# Patient Record
Sex: Male | Born: 1995 | Race: White | Hispanic: No | Marital: Single | Smoking: Current every day smoker
Health system: Southern US, Community
[De-identification: ages and names within clinical notes are randomized; demographics above are authoritative.]

---
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
## 2014-12-10 ENCOUNTER — Ambulatory Visit: Payer: Self-pay | Admitting: Family Medicine

## 2017-08-07 ENCOUNTER — Ambulatory Visit: Payer: Self-pay | Admitting: Family Medicine

## 2019-07-03 ENCOUNTER — Other Ambulatory Visit: Payer: Self-pay

## 2019-07-03 ENCOUNTER — Encounter: Payer: Self-pay | Admitting: Adult Health

## 2019-07-03 ENCOUNTER — Ambulatory Visit (INDEPENDENT_AMBULATORY_CARE_PROVIDER_SITE_OTHER): Payer: Managed Care, Other (non HMO) | Admitting: Adult Health

## 2019-07-03 VITALS — BP 138/86 | HR 92 | Temp 97.3°F | Resp 16 | Ht 67.0 in | Wt 128.4 lb

## 2019-07-03 DIAGNOSIS — Z0000 Encounter for general adult medical examination without abnormal findings: Secondary | ICD-10-CM

## 2019-07-03 DIAGNOSIS — F909 Attention-deficit hyperactivity disorder, unspecified type: Secondary | ICD-10-CM

## 2019-07-03 DIAGNOSIS — Z1389 Encounter for screening for other disorder: Secondary | ICD-10-CM

## 2019-07-03 DIAGNOSIS — R5383 Other fatigue: Secondary | ICD-10-CM

## 2019-07-03 DIAGNOSIS — E559 Vitamin D deficiency, unspecified: Secondary | ICD-10-CM | POA: Insufficient documentation

## 2019-07-03 DIAGNOSIS — F17200 Nicotine dependence, unspecified, uncomplicated: Secondary | ICD-10-CM | POA: Diagnosis not present

## 2019-07-03 DIAGNOSIS — Z13220 Encounter for screening for lipoid disorders: Secondary | ICD-10-CM

## 2019-07-03 DIAGNOSIS — R41840 Attention and concentration deficit: Secondary | ICD-10-CM | POA: Insufficient documentation

## 2019-07-03 DIAGNOSIS — Z8659 Personal history of other mental and behavioral disorders: Secondary | ICD-10-CM

## 2019-07-03 DIAGNOSIS — Z1329 Encounter for screening for other suspected endocrine disorder: Secondary | ICD-10-CM

## 2019-07-03 NOTE — Progress Notes (Signed)
New patient visit   Patient: Kyle Archer   DOB: 05-10-1995   23 y.o. Male  MRN: [id number] Visit Date: 07/03/2019  Today's healthcare provider: Jairo Ben, FNP   Chief Complaint  Patient presents with  . New Patient (Initial Visit)   Subjective    Kyle Archer is a 24 y.o. male who presents today as a new patient to establish care.  HPI  Patient presents in office today to discuss medication options to help with ADHD. Patient states that he was diagnosed with ADHD in the 5th grade and has previosuly been on Adderall 50mg ( not usual dosage) and Vyvanse 30mg . Patient states that when he was on medication he had loss of appetite. He stopped these medications when he was 17 and did well. He reports he was seen at .  Denies nay heart disease or issues.   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 Patient reports that he feels well today, he states that he follows a well balanced diet and is active with work duties but is not actively exercising. Patient works 3rd shift and reports that his sleep habits are poor.   Patient  denies any fever, body aches,chills, rash, chest pain, shortness of breath, nausea, vomiting, or diarrhea.   Denies dizziness, lightheadedness, pre syncopal or syncopal episodes.  He denies cigarette and has tried one or two in the past.  He vapes nicotine.  Denies any drug use.        No family status information on file.   No family history on file. Social History   Socioeconomic History  . Marital status: Single    Spouse name: Not on file  . Number of children: Not on file  . Years of education: Not on file  . Highest education level: Not on file  Occupational History  . Not on file  Tobacco Use  . Smoking status: Current Every Day Smoker  . Smokeless tobacco: Never  Used  Substance and Sexual Activity  . Alcohol use: Never  . Drug use: Never  . Sexual activity: Not on file  Other Topics Concern  . Not on file  Social History Narrative  . Not on file   Social Determinants of Health   Financial Resource Strain:   . Difficulty of Paying Living Expenses:   Food Insecurity:   . Worried About in the Last Year:   . [employer] Rx in the Last Year:   Transportation Needs:   . Programme researcher, broadcasting/film/video (Medical):   Barista Lack of Transportation (Non-Medical):   Physical Activity:   . Days of Exercise per Week:   . Minutes of Exercise per Session:   Stress:   . Feeling of Stress :   Social Connections:   . Frequency of Communication with Friends and Family:   . Frequency of Social Gatherings with Friends and Family:   . Attends Religious Services:   . Active Member of Clubs or Organizations:   . Attends Freight forwarder Meetings:   Marland Kitchen Marital Status:    No outpatient medications prior to visit.   No facility-administered medications prior to visit.   Not on File   There is no immunization history on file for this patient.  Health Maintenance  Topic Date Due  . HIV Screening  Never done  . TETANUS/TDAP  Never done  . INFLUENZA VACCINE  09/20/2019    Patient Care Team: Berniece Pap, FNP as PCP - General (Family Medicine)  Review of Systems  Constitutional: Positive for fatigue. Negative for activity change, chills, diaphoresis, fever and unexpected weight change.  HENT: Negative.   Eyes: Negative.   Respiratory: Negative.   Cardiovascular: Negative.   Gastrointestinal: Negative.   Genitourinary: Negative.   Musculoskeletal: Negative.   Skin: Negative.   Neurological: Negative.   Hematological: Negative for adenopathy. Does not bruise/bleed easily.  Psychiatric/Behavioral: Positive for decreased concentration. Negative for agitation, behavioral problems, confusion, dysphoric mood, hallucinations,  self-injury, sleep disturbance and suicidal ideas. The patient is nervous/anxious. The patient is not hyperactive.   All other systems reviewed and are negative.     Objective    BP 138/86   Pulse (!) 103   Temp (!) 97.3 F (36.3 C) (Oral)   Resp 16   Ht 5\' 7"  (1.702 m)   Wt 128 lb 6.4 oz (58.2 kg)   SpO2 97%   BMI 20.11 kg/m  Physical Exam Vitals and nursing note reviewed.  Constitutional:      General: He is not in acute distress.    Appearance: Normal appearance. He is well-developed. He is not ill-appearing, toxic-appearing or diaphoretic.     Comments: Patient appers well, not sickly. Speaking in complete sentences. Patient moves on and off of exam table and in room without difficulty. Gait is normal in hall and in room. Patient is oriented to person place time and situation. Patient answers questions appropriately and engages eye contact and verbal dialect with provider.    HENT:     Head: Normocephalic and atraumatic.     Right Ear: Hearing, tympanic membrane, ear canal and external ear normal.     Left Ear: Hearing, tympanic membrane, ear canal and external ear normal.     Nose: Nose normal.     Mouth/Throat:     Pharynx: Uvula midline. No oropharyngeal exudate.  Eyes:     General: Lids are normal. No scleral icterus.       Right eye: No discharge.        Left eye: No discharge.     Conjunctiva/sclera: Conjunctivae normal.     Pupils: Pupils are equal, round, and reactive to light.  Neck:     Thyroid: No thyromegaly.     Vascular: Normal carotid pulses. No carotid bruit, hepatojugular reflux or JVD.     Trachea: Trachea and phonation normal. No tracheal tenderness or tracheal deviation.     Meningeal: Brudzinski's sign absent.  Cardiovascular:     Rate and Rhythm: Normal rate and regular rhythm.     Pulses: Normal pulses.     Heart sounds: Normal heart sounds, S1 normal and S2 normal. Heart sounds not distant. No murmur. No friction rub. No gallop.   Pulmonary:       Effort: Pulmonary effort is normal. No accessory muscle usage or respiratory distress.     Breath sounds: Normal breath sounds. No stridor. No wheezing, rhonchi or rales.  Chest:     Chest wall: No tenderness.  Abdominal:     General: Bowel sounds are normal. There is no distension.     Palpations: Abdomen is soft. There is no mass.     Tenderness: There is no abdominal tenderness. There is no right CVA tenderness, left CVA tenderness, guarding or rebound.     Hernia: No hernia is present.  Genitourinary:    Comments: Declined  Musculoskeletal:        General: No tenderness or deformity. Normal range of motion.     Cervical back: Full passive range of motion without pain, normal range of motion and neck supple.     Comments: Patient moves on and off of exam table and in room without difficulty. Gait is normal in hall and in room. Patient is oriented to person place time and situation. Patient answers questions appropriately and engages in conversation.   Lymphadenopathy:     Head:     Right side of head: No submental, submandibular, tonsillar, preauricular, posterior auricular or occipital adenopathy.     Left side of head: No submental, submandibular, tonsillar, preauricular, posterior auricular or occipital adenopathy.     Cervical: No cervical adenopathy.  Skin:    General: Skin is warm and dry.     Capillary Refill: Capillary refill takes less than 2 seconds.     Coloration: Skin is not pale.     Findings: No erythema or rash.     Nails: There is no clubbing.  Neurological:     General: No focal deficit present.     Mental Status: He is alert and oriented to person, place, and time.     GCS: GCS eye subscore is 4. GCS verbal subscore is 5. GCS motor subscore is 6.     Cranial Nerves: No cranial nerve deficit.     Sensory: No sensory deficit.     Motor: No weakness or abnormal muscle tone.     Coordination: Coordination normal.     Gait: Gait normal.     Deep Tendon  Reflexes: Reflexes are normal and symmetric. Reflexes normal.  Psychiatric:        Speech: Speech normal.        Behavior: Behavior normal.        Thought Content: Thought content normal.        Judgment: Judgment normal.    Adult Self-Report Scale (ASRS v1.1) for ADHD- score of  6  Diagnoses adult ADHD. IMPORTANT This is intended for patients 18 years and older. Select the response that best describes how the patient has felt and conducted his/herself over the past 6 months. How often do you have trouble wrapping up the final details of a project, once the challenging parts have been done? Never Rarely Sometimes Often Very often How often do you have difficulty getting things in order when you have to do a task that requires organization? Never Rarely Sometimes Often Very often How often do you have problems remembering appointments or obligations? Never Rarely Sometimes Often Very often When you have a task that requires a lot of thought, how often do you avoid or delay getting started? Never Rarely Sometimes Often Very often How often do you fidget or squirm with your hands or feet when you have to sit down for a long time? Never Rarely Sometimes Often Very often How often do you feel overly active and compelled to do things, like you were driven by a motor? Never Rarely Sometimes Often Very often  6 points Scores ?4 may be consistent with Adult ADHD.  Depression Screen  Depression screen [facility] 2/9 07/03/2019  Decreased Interest 3  Down, Depressed, Hopeless 0  PHQ - 2 Score 3  Altered sleeping 1  Tired, decreased energy 2  Change in appetite 1  Feeling bad or failure about yourself  0  Trouble concentrating 3  Moving slowly or  fidgety/restless 3  Suicidal thoughts 0  PHQ-9 Score 13  Difficult doing work/chores Very difficult    No flowsheet data found.  No flowsheet data found. No results found for any visits on 07/03/19.  Assessment & Plan        .History of ADHD - Plan: CBC with Differential/Platelet, Comprehensive Metabolic Panel (CMET), Pain Mgt Scrn (14 Drugs), Ur  Concentration deficit - Plan: TSH, VITAMIN D 25 Hydroxy (Vit-D Deficiency, Fractures), Pain Mgt Scrn (14 Drugs), Ur  Vitamin D insufficiency - Plan: VITAMIN D 25 Hydroxy (Vit-D Deficiency, Fractures)  Screening for thyroid disorder  Screening cholesterol level  Routine health maintenance - Plan: CBC with Differential/Platelet, Comprehensive Metabolic Panel (CMET), Lipid panel  Screening for substance abuse - Plan: Pain Mgt Scrn (14 Drugs), Ur  Fatigue, unspecified type - Plan: CBC with Differential/Platelet, Comprehensive Metabolic Panel (CMET), TSH  Screening for blood or protein in urine - Plan: Urinalysis, microscopic only  Smoking addiction  Attention deficit hyperactivity disorder (ADHD), unspecified ADHD type  Orders Placed This Encounter  Procedures  . CBC with Differential/Platelet  . Comprehensive Metabolic Panel (CMET)  . TSH  . VITAMIN D 25 Hydroxy (Vit-D Deficiency, Fractures)  . Pain Mgt Scrn (14 Drugs), Ur  . Lipid panel  . Urinalysis, microscopic only   Discussed health maintenance such as yearly eye exams recommended and as well as biannual dental cleanings and exams.  Patient verbalized he would schedule those.  Patient also advised of self testicular exams as well as self breast exams.  Patient also is interesting starting back on Adderall, records will be requested from [facility] pediatrics for patient's history, patient agrees to drug testing today and periodic drug testing at providers discretion if Adderall is prescribed. She will have to keep follow-ups and medication would not be refilled if follow-ups are not capped.  Patient verbalized an understanding verbally of this as well.  We will wait for all the above test to return before starting any medication.  Also discussed referral to [place] [facility] does specialize in ADHD care, patient declines at this time but he will keep this in mind and let me know or should he not do well with the medication will send to the specialist for further care.  Effects of Adderall were discussed, patient will be started back on a low dose of Adderall only and see how he tolerates it and will only progress to other medications if needed and probably will refer to psychiatric care if Adderall does not help his symptoms versus adding on an additional medication.  Return in about 1 month (around 08/03/2019), or if symptoms worsen or fail to improve, for at any time for any worsening symptoms, Go to Emergency room/ urgent care if worse.  Advised patient call the office or your primary care doctor for an appointment if no improvement within 72 hours or if any symptoms change or worsen at any time  Advised ER or urgent Care if after hours or on weekend. Call 911 for emergency symptoms at any time.Patinet verbalized understanding of all instructions given/reviewed and treatment plan and has no further questions or concerns at this time.    IBeverely Pace Aedyn Kempfer, FNP, have reviewed all documentation for this visit. The documentation on 07/03/19 for the exam, diagnosis, procedures, and orders are all accurate and complete.    Jairo Ben, FNP  [facility] [phone] (phone) [phone] (fax)  [facility]

## 2019-07-03 NOTE — Patient Instructions (Addendum)
Health Maintenance, Male Adopting a healthy lifestyle and getting preventive care are important in promoting health and wellness. Ask your health care provider about:  The right schedule for you to have regular tests and exams.  Things you can do on your own to prevent diseases and keep yourself healthy. What should I know about diet, weight, and exercise? Eat a healthy diet   Eat a diet that includes plenty of vegetables, fruits, low-fat dairy products, and lean protein.  Do not eat a lot of foods that are high in solid fats, added sugars, or sodium. Maintain a healthy weight Body mass index (BMI) is a measurement that can be used to identify possible weight problems. It estimates body fat based on height and weight. Your health care provider can help determine your BMI and help you achieve or maintain a healthy weight. Get regular exercise Get regular exercise. This is one of the most important things you can do for your health. Most adults should:  Exercise for at least 150 minutes each week. The exercise should increase your heart rate and make you sweat (moderate-intensity exercise).  Do strengthening exercises at least twice a week. This is in addition to the moderate-intensity exercise.  Spend less time sitting. Even light physical activity can be beneficial. Watch cholesterol and blood lipids Have your blood tested for lipids and cholesterol at 25 years of age, then have this test every 5 years. You may need to have your cholesterol levels checked more often if:  Your lipid or cholesterol levels are high.  You are older than 25 years of age.  You are at high risk for heart disease. What should I know about cancer screening? Many types of cancers can be detected early and may often be prevented. Depending on your health history and family history, you may need to have cancer screening at various ages. This may include screening for:  Colorectal cancer.  Prostate  cancer.  Skin cancer.  Lung cancer. What should I know about heart disease, diabetes, and high blood pressure? Blood pressure and heart disease  High blood pressure causes heart disease and increases the risk of stroke. This is more likely to develop in people who have high blood pressure readings, are of African descent, or are overweight.  Talk with your health care provider about your target blood pressure readings.  Have your blood pressure checked: ? Every 3-5 years if you are 18-39 years of age. ? Every year if you are 40 years old or older.  If you are between the ages of 65 and 75 and are a current or former smoker, ask your health care provider if you should have a one-time screening for abdominal aortic aneurysm (AAA). Diabetes Have regular diabetes screenings. This checks your fasting blood sugar level. Have the screening done:  Once every three years after age 45 if you are at a normal weight and have a low risk for diabetes.  More often and at a younger age if you are overweight or have a high risk for diabetes. What should I know about preventing infection? Hepatitis B If you have a higher risk for hepatitis B, you should be screened for this virus. Talk with your health care provider to find out if you are at risk for hepatitis B infection. Hepatitis C Blood testing is recommended for:  Everyone born from 1945 through 1965.  Anyone with known risk factors for hepatitis C. Sexually transmitted infections (STIs)  You should be screened each year   for STIs, including gonorrhea and chlamydia, if: ? You are sexually active and are younger than 25 years of age. ? You are older than 25 years of age and your health care provider tells you that you are at risk for this type of infection. ? Your sexual activity has changed since you were last screened, and you are at increased risk for chlamydia or gonorrhea. Ask your health care provider if you are at risk.  Ask your  health care provider about whether you are at high risk for HIV. Your health care provider may recommend a prescription medicine to help prevent HIV infection. If you choose to take medicine to prevent HIV, you should first get tested for HIV. You should then be tested every 3 months for as long as you are taking the medicine. Follow these instructions at home: Lifestyle  Do not use any products that contain nicotine or tobacco, such as cigarettes, e-cigarettes, and chewing tobacco. If you need help quitting, ask your health care provider.  Do not use street drugs.  Do not share needles.  Ask your health care provider for help if you need support or information about quitting drugs. Alcohol use  Do not drink alcohol if your health care provider tells you not to drink.  If you drink alcohol: ? Limit how much you have to 0-2 drinks a day. ? Be aware of how much alcohol is in your drink. In the [place] one drink equals one 12 oz bottle of beer (355 mL), one 5 oz glass of wine (148 mL), or one 1 oz glass of hard liquor (44 mL). General instructions  Schedule regular health, dental, and eye exams.  Stay current with your vaccines.  Tell your health care provider if: ? You often feel depressed. ? You have ever been abused or do not feel safe at home. Summary  Adopting a healthy lifestyle and getting preventive care are important in promoting health and wellness.  Follow your health care provider's instructions about healthy diet, exercising, and getting tested or screened for diseases.  Follow your health care provider's instructions on monitoring your cholesterol and blood pressure. This information is not intended to replace advice given to you by your health care provider. Make sure you discuss any questions you have with your health care provider. Document Revised: 01/29/2018 Document Reviewed: 01/29/2018 Elsevier Patient Education  2020 [employer] Amphetamine; Dextroamphetamine  tablets What is this medicine? AMPHETAMINE; DEXTROAMPHETAMINE(am FET a meen; dex troe am FET a meen) is used to treat attention-deficit hyperactivity disorder (ADHD). It may also be used for narcolepsy. Federal law prohibits giving this medicine to any person other than the person for whom it was prescribed. Do not share this medicine with anyone else. This medicine may be used for other purposes; ask your health care provider or pharmacist if you have questions. COMMON BRAND NAME(S): Adderall What should I tell my health care provider before I take this medicine? They need to know if you have any of these conditions:  anxiety or panic attacks  circulation problems in fingers and toes  glaucoma  hardening or blockages of the arteries or heart blood vessels  heart disease or a heart defect  high blood pressure  history of a drug or alcohol abuse problem  history of stroke  kidney disease  liver disease  mental illness  seizures  suicidal thoughts, plans, or attempt; a previous suicide attempt by you or a family member  thyroid disease  Tourette's syndrome  an unusual or allergic reaction to dextroamphetamine, other amphetamines, other medicines, foods, dyes, or preservatives  pregnant or trying to get pregnant  breast-feeding How should I use this medicine? Take this medicine by mouth with a glass of water. Follow the directions on the prescription label. Take your doses at regular intervals. Do not take your medicine more often than directed. Do not suddenly stop your medicine. You must gradually reduce the dose or you may feel withdrawal effects. Ask your doctor or health care professional for advice. Talk to your pediatrician regarding the use of this medicine in children. Special care may be needed. While this drug may be prescribed for children as young as 3 years for selected conditions, precautions do apply. Overdosage: If you think you have taken too much of this  medicine contact a poison control center or emergency room at once. NOTE: This medicine is only for you. Do not share this medicine with others. What if I miss a dose? If you miss a dose, take it as soon as you can. If it is almost time for your next dose, take only that dose. Do not take double or extra doses. What may interact with this medicine? Do not take this medicine with any of the following medications:  MAOIs like Carbex, Eldepryl, Marplan, Nardil, and Parnate  other stimulant medicines for attention disorders This medicine may also interact with the following medications:  acetazolamide  ammonium chloride  antacids  ascorbic acid  atomoxetine  caffeine  certain medicines for blood pressure  certain medicines for depression, anxiety, or psychotic disturbances  certain medicines for seizures like carbamazepine, phenobarbital, phenytoin  certain medicines for stomach problems like cimetidine, ranitidine, famotidine, esomeprazole, omeprazole, lansoprazole, pantoprazole  lithium  medicines for colds and breathing difficulties  medicines for diabetes  medicines or dietary supplements for weight loss or to stay awake  methenamine  narcotic medicines for pain  quinidine  ritonavir  sodium bicarbonate  St. John's wort This list may not describe all possible interactions. Give your health care provider a list of all the medicines, herbs, non-prescription drugs, or dietary supplements you use. Also tell them if you smoke, drink alcohol, or use illegal drugs. Some items may interact with your medicine. What should I watch for while using this medicine? Visit your doctor or health care professional for regular checks on your progress. This prescription requires that you follow special procedures with your doctor and pharmacy. You will need to have a new written prescription from your doctor every time you need a refill. This medicine may affect your concentration,  or hide signs of tiredness. Until you know how this medicine affects you, do not drive, ride a bicycle, use machinery, or do anything that needs mental alertness. Tell your doctor or health care professional if this medicine loses its effects, or if you feel you need to take more than the prescribed amount. Do not change the dosage without talking to your doctor or health care professional. Decreased appetite is a common side effect when starting this medicine. Eating small, frequent meals or snacks can help. Talk to your doctor if you continue to have poor eating habits. Height and weight growth of a child taking this medicine will be monitored closely. Do not take this medicine close to bedtime. It may prevent you from sleeping. If you are going to need surgery, a MRI, CT scan, or other procedure, tell your doctor that you are taking this medicine. You may need to stop taking this  medicine before the procedure. Tell your doctor or healthcare professional right away if you notice unexplained wounds on your fingers and toes while taking this medicine. You should also tell your healthcare provider if you experience numbness or pain, changes in the skin color, or sensitivity to temperature in your fingers or toes. What side effects may I notice from receiving this medicine? Side effects that you should report to your doctor or health care professional as soon as possible:  allergic reactions like skin rash, itching or hives, swelling of the face, lips, or tongue  anxious  breathing problems  changes in emotions or moods  changes in vision  chest pain or chest tightness  fast, irregular heartbeat  fingers or toes feel numb, cool, painful  hallucination, loss of contact with reality  high blood pressure  males: prolonged or painful erection  seizures  signs and symptoms of serotonin syndrome like confusion, increased sweating, fever, tremor, stiff muscles, diarrhea  signs and symptoms  of a stroke like changes in vision; confusion; trouble speaking or understanding; severe headaches; sudden numbness or weakness of the face, arm or leg; trouble walking; dizziness; loss of balance or coordination  suicidal thoughts or other mood changes  uncontrollable head, mouth, neck, arm, or leg movements Side effects that usually do not require medical attention (report to your doctor or health care professional if they continue or are bothersome):  dry mouth  headache  irritability  loss of appetite  nausea  trouble sleeping  weight loss This list may not describe all possible side effects. Call your doctor for medical advice about side effects. You may report side effects to FDA at 1-800-FDA-1088. Where should I keep my medicine? Keep out of the reach of children. This medicine can be abused. Keep your medicine in a safe place to protect it from theft. Do not share this medicine with anyone. Selling or giving away this medicine is dangerous and against the law. Store at room temperature between 15 and 30 degrees C (59 and 86 degrees F). Keep container tightly closed. Throw away any unused medicine after the expiration date. Dispose of properly. This medicine may cause accidental overdose and death if it is taken by other adults, children, or pets. Mix any unused medicine with a substance like cat litter or coffee grounds. Then throw the medicine away in a sealed container like a sealed bag or a coffee can with a lid. Do not use the medicine after the expiration date. NOTE: This sheet is a summary. It may not cover all possible information. If you have questions about this medicine, talk to your doctor, pharmacist, or health care provider.  2020 Elsevier/Gold Standard (2016-03-30 44:01:02) Attention Deficit Hyperactivity Disorder, Adult Attention deficit hyperactivity disorder (ADHD) is a mental health disorder that starts during childhood (neurodevelopmental disorder). For many  people with ADHD, the disorder continues into the adult years. Treatment can help you manage your symptoms. What are the causes? The exact cause of ADHD is not known. Most experts believe genetics and environmental factors contribute to ADHD. What increases the risk? The following factors may make you more likely to develop this condition:  Having a family history of ADHD.  Being male.  Being born to a mother who smoked or drank alcohol during pregnancy.  Being exposed to lead or other toxins in the womb or early in life.  Being born before 37 weeks of pregnancy (prematurely) or at a low birth weight.  Having experienced a brain injury. What  are the signs or symptoms? Symptoms of this condition depend on the type of ADHD. The two main types are inattentive and hyperactive-impulsive. Some people may have symptoms of both types. Symptoms of the inattentive type include:  Difficulty paying attention.  Making careless mistakes.  Not following instructions.  Being disorganized.  Avoiding tasks that require time and attention.  Losing and forgetting things.  Being easily distracted. Symptoms of the hyperactive-impulsive type include:  Restlessness.  Talking too much.  Interrupting.  Difficulty with: ? Sitting still. ? Feeling motivated. ? Relaxing. ? Waiting in line or waiting for a turn. In adults, this condition may lead to certain problems, such as:  Keeping jobs.  Performing tasks at work.  Having stable relationships.  Being on time or keeping to a schedule. How is this diagnosed? This condition is diagnosed based on your current symptoms and your history of symptoms. The diagnosis can be made by a health care provider such as a primary care provider or a mental health care specialist. Your health care provider may use a symptom checklist or a behavior rating scale to evaluate your symptoms. He or she may also want to talk with people who have observed your  behaviors throughout your life. How is this treated? This condition can be treated with medicines and behavior therapy. Medicines may be the best option to reduce impulsive behaviors and improve attention. Your health care provider may recommend:  Stimulant medicines. These are the most common medicines used for adult ADHD. They affect certain chemicals in the brain (neurotransmitters) and improve your ability to control your symptoms.  A non-stimulant medicine for adult ADHD (atomoxetine). This medicine increases a neurotransmitter called norepinephrine. It may take weeks to months to see effects from this medicine. Counseling and behavioral management are also important for treating ADHD. Counseling is often used along with medicine. Your health care provider may suggest:  Cognitive behavioral therapy (CBT). This type of therapy teaches you to replace negative thoughts and actions with positive thoughts and actions. When used as part of ADHD treatment, this therapy may also include: ? Coping strategies for organization, time management, impulse control, and stress reduction. ? Mindfulness and meditation training.  Behavioral management. You may work with a Psychologist, occupational who is specially trained to help people with ADHD manage and organize activities and function more effectively. Follow these instructions at home: Medicines   Take over-the-counter and prescription medicines only as told by your health care provider.  Talk with your health care provider about the possible side effects of your medicines and how to manage them. Lifestyle   Do not use drugs.  Do not drink alcohol if: ? Your health care provider tells you not to drink. ? You are pregnant, may be pregnant, or are planning to become pregnant.  If you drink alcohol: ? Limit how much you use to:  0-1 drink a day for women.  0-2 drinks a day for men. ? Be aware of how much alcohol is in your drink. In the [place] one drink equals  one 12 oz bottle of beer (355 mL), one 5 oz glass of wine (148 mL), or one 1 oz glass of hard liquor (44 mL).  Get enough sleep.  Eat a healthy diet.  Exercise regularly. Exercise can help to reduce stress and anxiety. General instructions  Learn as much as you can about adult ADHD, and work closely with your health care providers to find the treatments that work best for you.  Follow the  same schedule each day.  Use reminder devices like notes, calendars, and phone apps to stay on time and organized.  Keep all follow-up visit                                                                                                              +                                                                                                                                                                          nn                                                 n  nn                                 h as: ? Repeated muscle twitches, coughing, or speech outbursts. ? Sleep problems. ? Loss of appetite. ? Dizziness. ? Unusually fast heartbeat. ? Stomach pains. ? Headaches.  You are struggling with anxiety, depression, or substance abuse. Get help right away if you:  Have a severe reaction to a medicine. If you ever feel like you may hurt yourself or others, or have thoughts about taking your own life, get help right away. You can go to the nearest emergency department or call:  Your local emergency services (911 in the [place]  A suicide crisis helpline, such as the National Suicide Prevention Lifeline at 725-049-01571-(262)344-7630. This is open 24 hours a day. Summary  ADHD is a mental health  disorder that starts during childhood (neurodevelopmental disorder) and often continues into the adult years.  The exact cause of ADHD is not known. Most experts believe genetics and environmental factors contribute to ADHD.  There is no cure for ADHD, but treatment with medicine, cognitive behavioral therapy, or behavioral management can help you manage your condition. This information is not intended to replace advice given to you by your health care provider. Make sure you discuss any questions you have with your health care provider. Document Revised: 06/30/2018 Document  Reviewed: 06/30/2018 Elsevier Patient Education  [employer].  Testicular Self-Exam A self-examination of your testicles (testicular self-exam) involves looking at and feeling your testicles for abnormal lumps or swelling. Several things can cause swelling, lumps, or pain in your testicles. Some of these causes are:  Injuries.  Inflammation.  Infection.  Buildup of fluids around your testicle (hydrocele).  Twisted testicles (testicular torsion).  Testicular cancer. Why is it important to do a testicular self-exam? Self-examination of the testicles and the left and right groin areas may be recommended if you are at risk for testicular cancer. Your groin is where your lower abdomen meets your upper thighs. You may be at risk for testicular cancer if you have:  An undescended testicle (cryptorchidism).  A history of previous testicular cancer.  A family history of testicular cancer. How to do a testicular self-exam The testicles are easiest to examine after a warm bath or shower. They are more difficult to examine when you are cold. This is because the muscles attached to the testicles retract and pull them up higher or into the abdomen. A normal testicle is egg-shaped and feels firm. It is smooth and not tender. The spermatic cord can be felt as a firm, spaghetti-like cord at the back of your testicle. Look  and feel for changes  Stand and hold your penis away from your body.  Look at each testicle to check for lumps or swelling.  Roll each testicle between your thumb and forefinger, feeling the entire testicle. Feel for: ? Lumps. ? Swelling. ? Discomfort.  Check the groin area between your abdomen and upper thighs on both sides of your body. Look and feel for any swelling or bumps that are tender. These could be enlarged lymph nodes. Contact a health care provider if:  You find any bumps or lumps, such as a small, hard, pea-sized lump.  You find swelling, pain, or soreness.  You see or feel any other changes in your testicles. Summary  A self-examination of your testicles (testicular self-exam) involves looking at and feeling your testicles for any changes.  Self-examination of the testicles and the left and right groin areas may be recommended if you are at risk for testicular cancer.  You should check each of your testicles for lumps, swelling, or discomfort.  You should check for swelling or tender bumps in your groin area between your lower abdomen and upper thighs. This information is not intended to replace advice given to you by your health care provider. Make sure you discuss any questions you have with your health care provider. Document Revised: 05/29/2018 Document Reviewed: 01/02/2016 Elsevier Patient Education  2020 [employer].

## 2019-07-04 LAB — URINALYSIS, MICROSCOPIC ONLY
Bacteria, UA: NONE SEEN
Casts: NONE SEEN /lpf
Epithelial Cells (non renal): NONE SEEN /hpf (ref 0–10)
RBC: NONE SEEN /hpf (ref 0–2)
WBC, UA: NONE SEEN /hpf (ref 0–5)

## 2019-07-05 LAB — MED LIST OPTION NOT SELECTED

## 2019-07-06 ENCOUNTER — Telehealth: Payer: Self-pay

## 2019-07-06 LAB — PAIN MGT SCRN (14 DRUGS), UR
Amphetamine Scrn, Ur: NEGATIVE ng/mL
BARBITURATE SCREEN URINE: NEGATIVE ng/mL
BENZODIAZEPINE SCREEN, URINE: NEGATIVE ng/mL
Buprenorphine, Urine: NEGATIVE ng/mL
CANNABINOIDS UR QL SCN: NEGATIVE ng/mL
Cocaine (Metab) Scrn, Ur: NEGATIVE ng/mL
Creatinine(Crt), U: 38.8 mg/dL (ref 20.0–300.0)
Fentanyl, Urine: NEGATIVE pg/mL
Meperidine Screen, Urine: NEGATIVE ng/mL
Methadone Screen, Urine: NEGATIVE ng/mL
OXYCODONE+OXYMORPHONE UR QL SCN: NEGATIVE ng/mL
Opiate Scrn, Ur: NEGATIVE ng/mL
Ph of Urine: 7.6 (ref 4.5–8.9)
Phencyclidine Qn, Ur: NEGATIVE ng/mL
Propoxyphene Scrn, Ur: NEGATIVE ng/mL
Tramadol Screen, Urine: NEGATIVE ng/mL

## 2019-07-06 NOTE — Progress Notes (Signed)
Drug testing negative. Will wait for his other labwork to result/ be drawn and then can start ADHD medication as discussed at last office visit. Recommend signing up for [facility].

## 2019-07-06 NOTE — Telephone Encounter (Signed)
Tried contacting patient and no answer or couldn't leave message due to voicemail box full. Well try patient at a later time.

## 2019-07-06 NOTE — Telephone Encounter (Signed)
-----   Message from Berniece Pap, FNP sent at 07/06/2019  8:44 AM EDT ----- Drug testing negative. Will wait for his other labwork to result/ be drawn and then can start ADHD medication as discussed at last office visit. Recommend signing up for [facility].

## 2019-07-08 NOTE — Telephone Encounter (Signed)
Attempted to contact patient home number and cell number are the same, voicemail box is full. If patient attempts to call office back okay for [facility] nurse triage to advise as below. KW

## 2019-07-08 NOTE — Telephone Encounter (Signed)
Patient called, unable to leave VM due to mailbox is full. 

## 2019-07-09 LAB — CBC WITH DIFFERENTIAL/PLATELET
Basophils Absolute: 0.1 10*3/uL (ref 0.0–0.2)
Basos: 1 %
EOS (ABSOLUTE): 0.1 10*3/uL (ref 0.0–0.4)
Eos: 2 %
Hematocrit: 43.2 % (ref 37.5–51.0)
Hemoglobin: 15.5 g/dL (ref 13.0–17.7)
Immature Grans (Abs): 0 10*3/uL (ref 0.0–0.1)
Immature Granulocytes: 0 %
Lymphocytes Absolute: 2.4 10*3/uL (ref 0.7–3.1)
Lymphs: 45 %
MCH: 30.6 pg (ref 26.6–33.0)
MCHC: 35.9 g/dL — ABNORMAL HIGH (ref 31.5–35.7)
MCV: 85 fL (ref 79–97)
Monocytes Absolute: 0.5 10*3/uL (ref 0.1–0.9)
Monocytes: 9 %
Neutrophils Absolute: 2.3 10*3/uL (ref 1.4–7.0)
Neutrophils: 43 %
Platelets: 303 10*3/uL (ref 150–450)
RBC: 5.06 x10E6/uL (ref 4.14–5.80)
RDW: 12.3 % (ref 11.6–15.4)
WBC: 5.3 10*3/uL (ref 3.4–10.8)

## 2019-07-09 LAB — LIPID PANEL
Chol/HDL Ratio: 2.3 ratio (ref 0.0–5.0)
Cholesterol, Total: 126 mg/dL (ref 100–199)
HDL: 54 mg/dL (ref >39)
LDL Chol Calc (NIH): 61 mg/dL (ref 0–99)
Triglycerides: 46 mg/dL (ref 0–149)
VLDL Cholesterol Cal: 11 mg/dL (ref 5–40)

## 2019-07-09 LAB — COMPREHENSIVE METABOLIC PANEL
ALT: 14 IU/L (ref 0–44)
AST: 20 IU/L (ref 0–40)
Albumin/Globulin Ratio: 2.4 — ABNORMAL HIGH (ref 1.2–2.2)
Albumin: 5.2 g/dL (ref 4.1–5.2)
Alkaline Phosphatase: 30 IU/L — ABNORMAL LOW (ref 48–121)
BUN/Creatinine Ratio: 10 (ref 9–20)
BUN: 10 mg/dL (ref 6–20)
Bilirubin Total: 3.3 mg/dL — ABNORMAL HIGH (ref 0.0–1.2)
CO2: 22 mmol/L (ref 20–29)
Calcium: 9.9 mg/dL (ref 8.7–10.2)
Chloride: 102 mmol/L (ref 96–106)
Creatinine, Ser: 1.02 mg/dL (ref 0.76–1.27)
GFR calc Af Amer: 118 mL/min/{1.73_m2} (ref >59)
GFR calc non Af Amer: 102 mL/min/{1.73_m2} (ref >59)
Globulin, Total: 2.2 g/dL (ref 1.5–4.5)
Glucose: 77 mg/dL (ref 65–99)
Potassium: 4.1 mmol/L (ref 3.5–5.2)
Sodium: 139 mmol/L (ref 134–144)
Total Protein: 7.4 g/dL (ref 6.0–8.5)

## 2019-07-09 LAB — TSH: TSH: 1.1 u[IU]/mL (ref 0.450–4.500)

## 2019-07-09 LAB — VITAMIN D 25 HYDROXY (VIT D DEFICIENCY, FRACTURES): Vit D, 25-Hydroxy: 17.9 ng/mL — ABNORMAL LOW (ref 30.0–100.0)

## 2019-07-10 ENCOUNTER — Telehealth: Payer: Self-pay

## 2019-07-10 NOTE — Telephone Encounter (Signed)
-----   Message from Berniece Pap, FNP sent at 07/10/2019  4:47 PM EDT ----- CBC within normal.  CMP - total bilirubin is elevated 3.3 - alkaline phosphatase is elevated as well.  Advise patient we want to recheck  fasting CMP in 2 weeks ( add lab) and question if any abdominal pain, nausea, vomiting, change in stool or stool color ?   Thyroid normal. Cholesterol normal.  Vitamin D deficiency, will hold treatment until repeat CMP.

## 2019-07-10 NOTE — Telephone Encounter (Signed)
Unable to reach patient as voicemail box is full. If patient returns call okay for [facility] triage to advise. KW

## 2019-07-10 NOTE — Progress Notes (Signed)
CBC within normal.  CMP - total bilirubin is elevated 3.3 - alkaline phosphatase is elevated as well.  Advise patient we want to recheck  fasting CMP in 2 weeks ( add lab) and question if any abdominal pain, nausea, vomiting, change in stool or stool color ?   Thyroid normal. Cholesterol normal.  Vitamin D deficiency, will hold treatment until repeat CMP.

## 2019-07-13 NOTE — Telephone Encounter (Signed)
After multiple attempts to reach patient with no response, will mail letter home. KW

## 2019-07-13 NOTE — Telephone Encounter (Signed)
After multiple attempts to reach patient with no response, will mail letter home. KW 

## 2019-08-04 ENCOUNTER — Encounter: Payer: Self-pay | Admitting: Adult Health

## 2019-08-04 ENCOUNTER — Ambulatory Visit (INDEPENDENT_AMBULATORY_CARE_PROVIDER_SITE_OTHER): Payer: Managed Care, Other (non HMO) | Admitting: Adult Health

## 2019-08-04 ENCOUNTER — Other Ambulatory Visit: Payer: Self-pay

## 2019-08-04 VITALS — BP 110/82 | HR 74 | Temp 96.6°F | Resp 16 | Wt 128.0 lb

## 2019-08-04 DIAGNOSIS — R748 Abnormal levels of other serum enzymes: Secondary | ICD-10-CM | POA: Diagnosis not present

## 2019-08-04 DIAGNOSIS — F909 Attention-deficit hyperactivity disorder, unspecified type: Secondary | ICD-10-CM | POA: Diagnosis not present

## 2019-08-04 MED ORDER — AMPHETAMINE-DEXTROAMPHET ER 15 MG PO CP24: 15.0000 mg | ORAL_CAPSULE | ORAL | 0 refills | Status: DC

## 2019-08-04 NOTE — Patient Instructions (Signed)
Attention Deficit Hyperactivity Disorder, Adult Attention deficit hyperactivity disorder (ADHD) is a mental health disorder that starts during childhood (neurodevelopmental disorder). For many people with ADHD, the disorder continues into the adult years. Treatment can help you manage your symptoms. What are the causes? The exact cause of ADHD is not known. Most experts believe genetics and environmental factors contribute to ADHD. What increases the risk? The following factors may make you more likely to develop this condition:  Having a family history of ADHD.  Being male.  Being born to a mother who smoked or drank alcohol during pregnancy.  Being exposed to lead or other toxins in the womb or early in life.  Being born before 37 weeks of pregnancy (prematurely) or at a low birth weight.  Having experienced a brain injury. What are the signs or symptoms? Symptoms of this condition depend on the type of ADHD. The two main types are inattentive and hyperactive-impulsive. Some people may have symptoms of both types. Symptoms of the inattentive type include:  Difficulty paying attention.  Making careless mistakes.  Not following instructions.  Being disorganized.  Avoiding tasks that require time and attention.  Losing and forgetting things.  Being easily distracted. Symptoms of the hyperactive-impulsive type include:  Restlessness.  Talking too much.  Interrupting.  Difficulty with: ? Sitting still. ? Feeling motivated. ? Relaxing. ? Waiting in line or waiting for a turn. In adults, this condition may lead to certain problems, such as:  Keeping jobs.  Performing tasks at work.  Having stable relationships.  Being on time or keeping to a schedule. How is this diagnosed? This condition is diagnosed based on your current symptoms and your history of symptoms. The diagnosis can be made by a health care provider such as a primary care provider or a mental health  care specialist. Your health care provider may use a symptom checklist or a behavior rating scale to evaluate your symptoms. He or she may also want to talk with people who have observed your behaviors throughout your life. How is this treated? This condition can be treated with medicines and behavior therapy. Medicines may be the best option to reduce impulsive behaviors and improve attention. Your health care provider may recommend:  Stimulant medicines. These are the most common medicines used for adult ADHD. They affect certain chemicals in the brain (neurotransmitters) and improve your ability to control your symptoms.  A non-stimulant medicine for adult ADHD (atomoxetine). This medicine increases a neurotransmitter called norepinephrine. It may take weeks to months to see effects from this medicine. Counseling and behavioral management are also important for treating ADHD. Counseling is often used along with medicine. Your health care provider may suggest:  Cognitive behavioral therapy (CBT). This type of therapy teaches you to replace negative thoughts and actions with positive thoughts and actions. When used as part of ADHD treatment, this therapy may also include: ? Coping strategies for organization, time management, impulse control, and stress reduction. ? Mindfulness and meditation training.  Behavioral management. You may work with a coach who is specially trained to help people with ADHD manage and organize activities and function more effectively. Follow these instructions at home: Medicines   Take over-the-counter and prescription medicines only as told by your health care provider.  Talk with your health care provider about the possible side effects of your medicines and how to manage them. Lifestyle   Do not use drugs.  Do not drink alcohol if: ? Your health care provider tells you   not to drink. ? You are pregnant, may be pregnant, or are planning to become  pregnant.  If you drink alcohol: ? Limit how much you use to:  0-1 drink a day for women.  0-2 drinks a day for men. ? Be aware of how much alcohol is in your drink. In the [place] one drink equals one 12 oz bottle of beer (355 mL), one 5 oz glass of wine (148 mL), or one 1 oz glass of hard liquor (44 mL).  Get enough sleep.  Eat a healthy diet.  Exercise regularly. Exercise can help to reduce stress and anxiety. General instructions  Learn as much as you can about adult ADHD, and work closely with your health care providers to find the treatments that work best for you.  Follow the same schedule each day.  Use reminder devices like notes, calendars, and phone apps to stay on time and organized.  Keep all follow-up visits as told by your health care provider and therapist. This is important. Where to find more information A health care provider may be able to recommend resources that are available online or over the phone. You could start with:  Attention Deficit Disorder Association (ADDA): www.add.org  National Institute of Mental Health (NIMH): www.nimh.nih.gov Contact a health care provider if:  Your symptoms continue to cause problems.  You have side effects from your medicine, such as: ? Repeated muscle twitches, coughing, or speech outbursts. ? Sleep problems. ? Loss of appetite. ? Dizziness. ? Unusually fast heartbeat. ? Stomach pains. ? Headaches.  You are struggling with anxiety, depression, or substance abuse. Get help right away if you:  Have a severe reaction to a medicine. If you ever feel like you may hurt yourself or others, or have thoughts about taking your own life, get help right away. You can go to the nearest emergency department or call:  Your local emergency services (911 in the [place]  A suicide crisis helpline, such as the National Suicide Prevention Lifeline at 1-800-273-8255. This is open 24 hours a day. Summary  ADHD is a mental health  disorder that starts during childhood (neurodevelopmental disorder) and often continues into the adult years.  The exact cause of ADHD is not known. Most experts believe genetics and environmental factors contribute to ADHD.  There is no cure for ADHD, but treatment with medicine, cognitive behavioral therapy, or behavioral management can help you manage your condition. This information is not intended to replace advice given to you by your health care provider. Make sure you discuss any questions you have with your health care provider. Document Revised: 06/30/2018 Document Reviewed: 06/30/2018 Elsevier Patient Education  2020 Elsevier Inc. Amphetamine; Dextroamphetamine extended-release capsules What is this medicine? AMPHETAMINE; DEXTROAMPHETAMINE (am FET a meen; dex troe am FET a meen) is used to treat attention-deficit hyperactivity disorder (ADHD). Federal law prohibits giving this medicine to any person other than the person for whom it was prescribed. Do not share this medicine with anyone else. This medicine may be used for other purposes; ask your health care provider or pharmacist if you have questions. COMMON BRAND NAME(S): Adderall XR, Mydayis What should I tell my health care provider before I take this medicine? They need to know if you have any of these conditions:  anxiety or panic attacks  circulation problems in fingers and toes  glaucoma  hardening or blockages of the arteries or heart blood vessels  heart disease or a heart defect  high blood pressure  history   of a drug or alcohol abuse problem  history of stroke  kidney disease  liver disease  mental illness  seizures  suicidal thoughts, plans, or attempt; a previous suicide attempt by you or a family member  thyroid disease  Tourette's syndrome  an unusual or allergic reaction to dextroamphetamine, other amphetamines, other medicines, foods, dyes, or preservatives  pregnant or trying to get  pregnant  breast-feeding How should I use this medicine? Take this medicine by mouth with a glass of water. Follow the directions on the prescription label. This medicine is taken just one time per day, usually in the morning after waking up. Take with or without food. Do not chew or crush this medicine. You may open the capsules and sprinkle the medicine on a spoonful of applesauce. If sprinkled on applesauce, take the dose immediately and do not crush or chew. Do not take your medicine more often than directed. A special MedGuide will be given to you by the pharmacist with each prescription and refill. Be sure to read this information carefully each time. Talk to your pediatrician regarding the use of this medicine in children. While this drug may be prescribed for children as young as 6 years for selected conditions, precautions do apply. Some extended-release capsules are recommended for use only in children 13 years of age and older. Overdosage: If you think you have taken too much of this medicine contact a poison control center or emergency room at once. NOTE: This medicine is only for you. Do not share this medicine with others. What if I miss a dose? If you miss a dose, take it as soon as you can in the morning, but do not take it later in the day because it can cause trouble sleeping. If it is almost time for your next dose, take only that dose. Do not take double or extra doses. What may interact with this medicine? Do not take this medicine with any of the following medications:  MAOIs like Carbex, Eldepryl, Marplan, Nardil, and Parnate  other stimulant medicines for attention disorders This medicine may also interact with the following medications:  acetazolamide  alcohol  ammonium chloride  antacids  ascorbic acid  atomoxetine  caffeine  certain medicines for blood pressure  certain medicines for depression, anxiety, or psychotic disturbances  certain medicines for  seizures like carbamazepine, phenobarbital, phenytoin  certain medicines for stomach problems like cimetidine, ranitidine, famotidine, esomeprazole, omeprazole, lansoprazole, pantoprazole  lithium  medicines for colds and breathing difficulties  medicines for diabetes  medicines or dietary supplements for weight loss or to stay awake  methenamine  narcotic medicines for pain  quinidine  ritonavir  sodium bicarbonate  St. John's wort This list may not describe all possible interactions. Give your health care provider a list of all the medicines, herbs, non-prescription drugs, or dietary supplements you use. Also tell them if you smoke, drink alcohol, or use illegal drugs. Some items may interact with your medicine. What should I watch for while using this medicine? Visit your doctor or health care professional for regular checks on your progress. This prescription requires that you follow special procedures with your doctor and pharmacy. You will need to have a new written prescription from your doctor every time you need a refill. This medicine may affect your concentration, or hide signs of tiredness. Until you know how this medicine affects you, do not drive, ride a bicycle, use machinery, or do anything that needs mental alertness. Alcohol should be avoided with   some brands of this medicine. Talk to your doctor or health care professional if you have questions. Tell your doctor or health care professional if this medicine loses its effects, or if you feel you need to take more than the prescribed amount. Do not change the dosage without talking to your doctor or health care professional. Decreased appetite is a common side effect when starting this medicine. Eating small, frequent meals or snacks can help. Talk to your doctor if you continue to have poor eating habits. Height and weight growth of a child taking this medicine will be monitored closely. Do not take this medicine close  to bedtime. It may prevent you from sleeping. If you are going to need surgery, an MRI, a CT scan, or other procedure, tell your doctor that you are taking this medicine. You may need to stop taking this medicine before the procedure. Tell your doctor or healthcare professional right away if you notice unexplained wounds on your fingers and toes while taking this medicine. You should also tell your healthcare provider if you experience numbness or pain, changes in the skin color, or sensitivity to temperature in your fingers or toes. What side effects may I notice from receiving this medicine? Side effects that you should report to your doctor or health care professional as soon as possible:  allergic reactions like skin rash, itching or hives, swelling of the face, lips, or tongue  anxious  breathing problems  changes in emotions or moods  changes in vision  chest pain or chest tightness  fast, irregular heartbeat  fingers or toes feel numb, cool, painful  hallucination, loss of contact with reality  high blood pressure  males: prolonged or painful erection  seizures  signs and symptoms of serotonin syndrome like confusion, increased sweating, fever, tremor, stiff muscles, diarrhea  signs and symptoms of a stroke like changes in vision; confusion; trouble speaking or understanding; severe headaches; sudden numbness or weakness of the face, arm or leg; trouble walking; dizziness; loss of balance or coordination  suicidal thoughts or other mood changes  uncontrollable head, mouth, neck, arm, or leg movements Side effects that usually do not require medical attention (report to your doctor or health care professional if they continue or are bothersome):  dry mouth  headache  irritability  loss of appetite  nausea  trouble sleeping  weight loss This list may not describe all possible side effects. Call your doctor for medical advice about side effects. You may report  side effects to FDA at 1-800-FDA-1088. Where should I keep my medicine? Keep out of the reach of children. This medicine can be abused. Keep your medicine in a safe place to protect it from theft. Do not share this medicine with anyone. Selling or giving away this medicine is dangerous and against the law. Store at room temperature between 15 and 30 degrees C (59 and 86 degrees F). Keep container tightly closed. Protect from light. Throw away any unused medicine after the expiration date. NOTE: This sheet is a summary. It may not cover all possible information. If you have questions about this medicine, talk to your doctor, pharmacist, or health care provider.  2020 Elsevier/Gold Standard (2016-04-01 13:37:27)  

## 2019-08-04 NOTE — Progress Notes (Signed)
Established patient visit   Patient: Kyle Archer   DOB: [birth year]   24 y.o. Male  MRN: [id number] Visit Date: 08/04/2019  Today's healthcare provider: Marcille Buffy, FNP   Clint Bolder as a scribe for [place], FNP.,have documented all relevant documentation on the behalf of [place] Adriene Padula, FNP,as directed by  Marcille Buffy, FNP while in the presence of Marcille Buffy, [place]. Chief Complaint  Patient presents with  . ADHD   Subjective    HPI  Follow up for ADHD  The patient was last seen for this 1 months ago. He has history of ADHD as a child. He was on medications.  He reports difficulty concentrating. Sleeps well. Decreased focus and problems staying on task. Works 3rd shift.  Denies any suicidal or homicidal ideations or intents currently or in the past.   Urine drug test was negative and he agrees to periodic drug testing at provider/ office discrestion if ADHD medications given.   Denies any head injury or trauma.  Changes made at last visit include none, referral placed for patient to be seen by [facility], and referral placed for [facility].   Patient has not heard regarding referrals. He will call them and let us know.   He does desire to start Adderal XL today. High scoring on ADHD self reporting. GAD also score of 9. Denies any cardiac symptoms or past history of cardiac diagnosis.   Alk phos mild low on CMP- will repeat today.   Patient  denies any fever, body aches,chills, rash, chest pain, shortness of breath, nausea, vomiting, or diarrhea.  Denies dizziness, lightheadedness, pre syncopal or syncopal episodes.   -----------------------------------------------------------------------------------------   Patient Active Problem List   Diagnosis Date Noted  . Low serum alkaline phosphatase 08/04/2019  . Concentration deficit 07/03/2019  . History of ADHD 07/03/2019  .  Vitamin D insufficiency 07/03/2019  . Routine general medical examination at health care facility 07/03/2019  . Fatigue 07/03/2019  . Smoking addiction 07/03/2019  . Attention deficit hyperactivity disorder (ADHD) 07/03/2019   History reviewed. No pertinent past medical history. Social History   Tobacco Use  . Smoking status: Current Every Day Smoker  . Smokeless tobacco: Never Used  Substance Use Topics  . Alcohol use: Never  . Drug use: Never   No Known Allergies     Medications: No outpatient medications prior to visit.   No facility-administered medications prior to visit.    Review of Systems  Constitutional: Negative.   HENT: Negative.   Respiratory: Negative.   Cardiovascular: Negative.   Gastrointestinal: Negative.   Musculoskeletal: Negative.   Neurological: Negative.   Psychiatric/Behavioral: Positive for decreased concentration. Negative for agitation, behavioral problems, confusion, dysphoric mood, hallucinations, self-injury, sleep disturbance and suicidal ideas. The patient is hyperactive. The patient is not nervous/anxious.     Last CBC Lab Results  Component Value Date   WBC 5.3 07/08/2019   HGB 15.5 07/08/2019   HCT 43.2 07/08/2019   MCV 85 07/08/2019   MCH 30.6 07/08/2019   RDW 12.3 07/08/2019   PLT 303 95/28/4132   Last metabolic panel Lab Results  Component Value Date   GLUCOSE 77 07/08/2019   NA 139 07/08/2019   K 4.1 07/08/2019   CL 102 07/08/2019   CO2 22 07/08/2019   BUN 10 07/08/2019   CREATININE 1.02 07/08/2019   GFRNONAA 102 07/08/2019   GFRAA 118 07/08/2019   CALCIUM 9.9 07/08/2019   PROT  7.4 07/08/2019   ALBUMIN 5.2 07/08/2019   LABGLOB 2.2 07/08/2019   AGRATIO 2.4 (H) 07/08/2019   BILITOT 3.3 (H) 07/08/2019   ALKPHOS 30 (L) 07/08/2019   AST 20 07/08/2019   ALT 14 07/08/2019   Last lipids Lab Results  Component Value Date   CHOL 126 07/08/2019   HDL 54 07/08/2019   LDLCALC 61 07/08/2019   TRIG 46 07/08/2019    CHOLHDL 2.3 07/08/2019   Last hemoglobin A1c No results found for: HGBA1C Last thyroid functions Lab Results  Component Value Date   TSH 1.100 07/08/2019   Last vitamin D Lab Results  Component Value Date   VD25OH 17.9 (L) 07/08/2019   Last vitamin B12 and Folate No results found for: VITAMINB12, FOLATE    Objective    BP 110/82   Pulse 74   Temp (!) 96.6 F (35.9 C) (Oral)   Resp 16   Wt 128 lb (58.1 kg)   SpO2 99%   BMI 20.05 kg/m  BP Readings from Last 3 Encounters:  08/04/19 110/82  07/03/19 138/86    Depression screen PHQ 2/9 07/03/2019  Decreased Interest 3  Down, Depressed, Hopeless 0  PHQ - 2 Score 3  Altered sleeping 1  Tired, decreased energy 2  Change in appetite 1  Feeling bad or failure about yourself  0  Trouble concentrating 3  Moving slowly or fidgety/restless 3  Suicidal thoughts 0  PHQ-9 Score 13  Difficult doing work/chores Very difficult      Physical Exam Vitals reviewed.  Constitutional:      General: He is not in acute distress.    Appearance: Normal appearance. He is not ill-appearing, toxic-appearing or diaphoretic.  HENT:     Head: Normocephalic and atraumatic.     Right Ear: External ear normal.     Left Ear: External ear normal.     Nose: Nose normal.     Mouth/Throat:     Mouth: Mucous membranes are moist.  Eyes:     Extraocular Movements: Extraocular movements intact.     Conjunctiva/sclera: Conjunctivae normal.     Pupils: Pupils are equal, round, and reactive to light.  Cardiovascular:     Rate and Rhythm: Normal rate and regular rhythm.     Pulses: Normal pulses.     Heart sounds: Normal heart sounds. No murmur heard.  No friction rub. No gallop.   Pulmonary:     Effort: Pulmonary effort is normal. No respiratory distress.     Breath sounds: Normal breath sounds. No stridor. No wheezing, rhonchi or rales.  Chest:     Chest wall: No tenderness.  Abdominal:     General: Bowel sounds are normal. There is no  distension.     Palpations: Abdomen is soft.     Tenderness: There is no abdominal tenderness.  Skin:    Capillary Refill: Capillary refill takes less than 2 seconds.  Neurological:     General: No focal deficit present.     Mental Status: He is alert and oriented to person, place, and time.     Cranial Nerves: No cranial nerve deficit.     Sensory: No sensory deficit.     Motor: No weakness.     Coordination: Coordination normal.     Gait: Gait normal.     Deep Tendon Reflexes: Reflexes normal.  Psychiatric:        Attention and Perception: Attention normal. He is attentive.        Mood and  Affect: Mood and affect normal.        Speech: Speech normal.        Behavior: Behavior is hyperactive (mild in office vist. ). Behavior is cooperative.        Thought Content: Thought content normal.        Cognition and Memory: Cognition and memory normal.        Judgment: Judgment normal.      No results found for any visits on 08/04/19.  Assessment & Plan     Attention deficit hyperactivity disorder (ADHD), unspecified ADHD type - Plan: Comprehensive Metabolic Panel (CMET)  Low serum alkaline phosphatase   Orders Placed This Encounter  Procedures  . Comprehensive Metabolic Panel (CMET)    Meds ordered this encounter  Medications  . amphetamine-dextroamphetamine (ADDERALL XR) 15 MG 24 hr capsule    Sig: Take 1 capsule by mouth every morning.    Dispense:  30 capsule    Refill:  0  call pharmacy for refill when needed.  Black box warning discussed for above medication and side effects. May need dose titration.  He agrees to random drug testing as needed/ at provider/ office discretion.  He has positive depression score, declines need for anxiety/ depression medication would like to try ADHD medication.  Recommend counseling.   Return in about 2 months (around 10/04/2019), or if symptoms worsen or fail to improve, for at any time for any worsening symptoms, Go to Emergency room/  urgent care if worse.      Advised patient call the office or your primary care doctor for an appointment if no improvement within 72 hours or if any symptoms change or worsen at any time  Advised ER or urgent Care if after hours or on weekend. Call 911 for emergency symptoms at any time.Patinet verbalized understanding of all instructions given/reviewed and treatment plan and has no further questions or concerns at this time.      I[place] Hampshire Daje Stark, FNP, have reviewed all documentation for this visit. The documentation on 08/04/19 for the exam, diagnosis, procedures, and orders are all accurate and complete.   Marcille Buffy, [place] [phone] (phone) [phone] (fax)  [place]

## 2019-09-22 ENCOUNTER — Other Ambulatory Visit: Payer: Self-pay | Admitting: Adult Health

## 2019-09-22 NOTE — Telephone Encounter (Signed)
amphetamine-dextroamphetamine (ADDERALL XR) 15 MG 24 hr capsule  [id number] - [place], [place] - [address] Phone:  [phone]  Fax:  [phone]

## 2019-09-22 NOTE — Telephone Encounter (Signed)
Requested medication (s) are due for refill today: yes  Requested medication (s) are on the active medication list: yes  Last refill:  07/25/19 #30 0 refills  Future visit scheduled: yes   Notes to clinic:  not delegated per protocol     Requested Prescriptions  Pending Prescriptions Disp Refills   amphetamine-dextroamphetamine (ADDERALL XR) 15 MG 24 hr capsule 30 capsule 0    Sig: Take 1 capsule by mouth every morning.      Not Delegated - Psychiatry:  Stimulants/ADHD Failed - 09/22/2019  5:44 PM      Failed - This refill cannot be delegated      Failed - Urine Drug Screen completed in last 360 days.      Passed - Valid encounter within last 3 months    Recent Outpatient Visits           1 month ago Attention deficit hyperactivity disorder (ADHD), unspecified ADHD type   [facility] Flinchum, Eula Fried, FNP   2 months ago Routine general medical examination at health care facility   [facility] Flinchum, Eula Fried, FNP       Future Appointments             In 1 week Flinchum, Eula Fried, FNP [facility]

## 2019-09-23 MED ORDER — AMPHETAMINE-DEXTROAMPHET ER 15 MG PO CP24: 15.0000 mg | ORAL_CAPSULE | ORAL | 0 refills | Status: AC

## 2019-09-29 ENCOUNTER — Telehealth: Payer: Self-pay

## 2019-09-29 NOTE — Telephone Encounter (Signed)
Copied from CRM [id number]. Topic: General - Call Back - No Documentation >> Sep 28, 2019  5:03 PM Randol Kern wrote: Reason for CRM: Pt called back stating that he missed a call from the office. Please advise  Best contact: [phone]

## 2019-10-05 ENCOUNTER — Ambulatory Visit: Payer: Self-pay | Admitting: Adult Health

## 2020-05-07 ENCOUNTER — Other Ambulatory Visit: Payer: Self-pay

## 2020-05-07 ENCOUNTER — Emergency Department: Payer: Self-pay

## 2020-05-07 ENCOUNTER — Emergency Department
Admission: EM | Admit: 2020-05-07 | Discharge: 2020-05-07 | Disposition: A | Discharge status: Home / Self Care | Payer: Self-pay | Attending: Emergency Medicine | Admitting: Emergency Medicine

## 2020-05-07 DIAGNOSIS — F17200 Nicotine dependence, unspecified, uncomplicated: Secondary | ICD-10-CM | POA: Insufficient documentation

## 2020-05-07 DIAGNOSIS — R0789 Other chest pain: Secondary | ICD-10-CM

## 2020-05-07 LAB — CBC
HCT: 40 % (ref 39.0–52.0)
Hemoglobin: 14.3 g/dL (ref 13.0–17.0)
MCH: 30.2 pg (ref 26.0–34.0)
MCHC: 35.8 g/dL (ref 30.0–36.0)
MCV: 84.6 fL (ref 80.0–100.0)
Platelets: 312 10*3/uL (ref 150–400)
RBC: 4.73 MIL/uL (ref 4.22–5.81)
RDW: 12.6 % (ref 11.5–15.5)
WBC: 6 10*3/uL (ref 4.0–10.5)
nRBC: 0 % (ref 0.0–0.2)

## 2020-05-07 LAB — COMPREHENSIVE METABOLIC PANEL
ALT: 19 U/L (ref 0–44)
AST: 24 U/L (ref 15–41)
Albumin: 4.9 g/dL (ref 3.5–5.0)
Alkaline Phosphatase: 27 U/L — ABNORMAL LOW (ref 38–126)
Anion gap: 6 (ref 5–15)
BUN: 13 mg/dL (ref 6–20)
CO2: 24 mmol/L (ref 22–32)
Calcium: 9.2 mg/dL (ref 8.9–10.3)
Chloride: 106 mmol/L (ref 98–111)
Creatinine, Ser: 0.79 mg/dL (ref 0.61–1.24)
GFR, Estimated: >60 mL/min (ref >60)
Glucose, Bld: 91 mg/dL (ref 70–99)
Potassium: 3.9 mmol/L (ref 3.5–5.1)
Sodium: 136 mmol/L (ref 135–145)
Total Bilirubin: 2.3 mg/dL — ABNORMAL HIGH (ref 0.3–1.2)
Total Protein: 7.5 g/dL (ref 6.5–8.1)

## 2020-05-07 LAB — TROPONIN I (HIGH SENSITIVITY): Troponin I (High Sensitivity): <2 ng/L (ref <18)

## 2020-05-07 NOTE — ED Triage Notes (Signed)
Pt states has had central chest pain off and on for two years. Pt states over last several days has become nearly constant. Pt denies shob, nausea, dizziness. Pt states does vape, appears in no acute distress.

## 2020-05-07 NOTE — ED Notes (Addendum)
ED Provider Kinner at bedside. 

## 2020-05-07 NOTE — ED Provider Notes (Signed)
[facility] Emergency Department Provider Note   ____________________________________________    I have reviewed the triage vital signs and the nursing notes.   HISTORY  Chief Complaint Chest Pain     HPI Kyle Archer is a 25 y.o. male who presents with complaints of chest discomfort.  Patient reports he has had central chest pain which is described as aching for nearly a year.  He reports it has been slightly worse over the last week but he was prompted to come to the emergency department to get it "checked out ".  By his boss when he told him about it.  Denies shortness of breath.  No pleurisy.  No nausea vomiting diaphoresis.  No history of heart disease.  Has not take anything for this  No past medical history on file.  Patient Active Problem List   Diagnosis Date Noted  . Low serum alkaline phosphatase 08/04/2019  . Concentration deficit 07/03/2019  . History of ADHD 07/03/2019  . Vitamin D insufficiency 07/03/2019  . Routine general medical examination at health care facility 07/03/2019  . Fatigue 07/03/2019  . Smoking addiction 07/03/2019  . Attention deficit hyperactivity disorder (ADHD) 07/03/2019    No past surgical history on file.  Prior to Admission medications   Medication Sig Start Date End Date Taking? Authorizing Provider  amphetamine-dextroamphetamine (ADDERALL XR) 15 MG 24 hr capsule Take 1 capsule by mouth every morning. 09/23/19   Flinchum, Eula Fried, FNP     Allergies Patient has no known allergies.  No family history on file.  Social History Social History   Tobacco Use  . Smoking status: Current Every Day Smoker  . Smokeless tobacco: Never Used  Substance Use Topics  . Alcohol use: Never  . Drug use: Never    Review of Systems  Constitutional: No fever/chills Eyes: No visual changes.  ENT: No sore throat. Cardiovascular: As above Respiratory: Denies shortness of breath. Gastrointestinal: No abdominal  pain.  No nausea, no vomiting.   Genitourinary: Negative for dysuria. Musculoskeletal: Negative for back pain. Skin: Negative for rash. Neurological: Negative for headaches or weakness   ____________________________________________   PHYSICAL EXAM:  VITAL SIGNS: ED Triage Vitals  Enc Vitals Group     BP 05/07/20 0402 127/81     Pulse Rate 05/07/20 0402 65     Resp 05/07/20 0402 18     Temp 05/07/20 0402 98.3 F (36.8 C)     Temp Source 05/07/20 0402 Oral     SpO2 05/07/20 0402 99 %     Weight 05/07/20 0356 59 kg (130 lb)     Height 05/07/20 0356 1.702 m (5\' 7" )     Head Circumference --      Peak Flow --      Pain Score 05/07/20 0356 1     Pain Loc --      Pain Edu? --      Excl. in GC? --     Constitutional: Alert and oriented. No acute distress. Pleasant and interactive  Nose: No congestion/rhinnorhea. Mouth/Throat: Mucous membranes are moist.    Cardiovascular: Normal rate, regular rhythm. Grossly normal heart sounds.  Good peripheral circulation. Respiratory: Normal respiratory effort.  No retractions. Lungs CTAB. Gastrointestinal: Soft and nontender. No distention.  No CVA tenderness. Genitourinary: deferred Musculoskeletal: No lower extremity tenderness nor edema.  Warm and well perfused Neurologic:  Normal speech and language. No gross focal neurologic deficits are appreciated.  Skin:  Skin is warm, dry and intact.  No rash noted. Psychiatric: Mood and affect are normal. Speech and behavior are normal.  ____________________________________________   LABS (all labs ordered are listed, but only abnormal results are displayed)  Labs Reviewed  COMPREHENSIVE METABOLIC PANEL - Abnormal; Notable for the following components:      Result Value   Alkaline Phosphatase 27 (*)    Total Bilirubin 2.3 (*)    All other components within normal limits  CBC  TROPONIN I (HIGH SENSITIVITY)   ____________________________________________  EKG  ED ECG REPORT I, [name], the attending physician, personally viewed and interpreted this ECG.  Date: 05/07/2020  Rhythm: normal sinus rhythm QRS Axis: normal Intervals: normal ST/T Wave abnormalities: normal Narrative Interpretation: no evidence of acute ischemia  ____________________________________________  RADIOLOGY  Chest x-ray reviewed by me ____________________________________________   PROCEDURES  Procedure(s) performed: No  Procedures   Critical Care performed: No ____________________________________________   INITIAL IMPRESSION / ASSESSMENT AND PLAN / ED COURSE  Pertinent labs & imaging results that were available during my care of the patient were reviewed by me and considered in my medical decision making (see chart for details).  Patient presents with chest discomfort as described above, appears to be chronic, very low risk for ACS, vital signs, exam reassuring.  EKG is normal.  Given worsening over the last week, will obtain labs, chest x-ray, anticipate discharge with outpatient follow-up   Work-up is quite reassuring, normal CBC CMP and troponin    ____________________________________________   FINAL CLINICAL IMPRESSION(S) / ED DIAGNOSES  Final diagnoses:  Atypical chest pain        Note:  This document was prepared using Dragon voice recognition software and may include unintentional dictation errors.   [name], MD 05/07/20 [phone]

## 2020-05-07 NOTE — ED Notes (Signed)
Pt presents via triage for c/o constant, midsternal chest pain x 2 days that is nonradiating and without associated symptoms. Reports slight improvement after taking ibuprofen at home.  Pt reports episodes of chest pain over the last year that last around 30 mins at a time.

## 2020-05-07 NOTE — ED Notes (Signed)
Pt ambulated to and from XR with rad tech.

## 2022-06-30 IMAGING — CR DG CHEST 2V
1 series · 2 of 2 positions shown · non-contrast
Comparison: None.

CLINICAL DATA: 24-year-old male with intermittent chest pain.

EXAM:
CHEST - 2 VIEW

[Series 1: dg chest 2 view · 0.14mm/px · 2 of 2 slices shown]
[im 1/2]
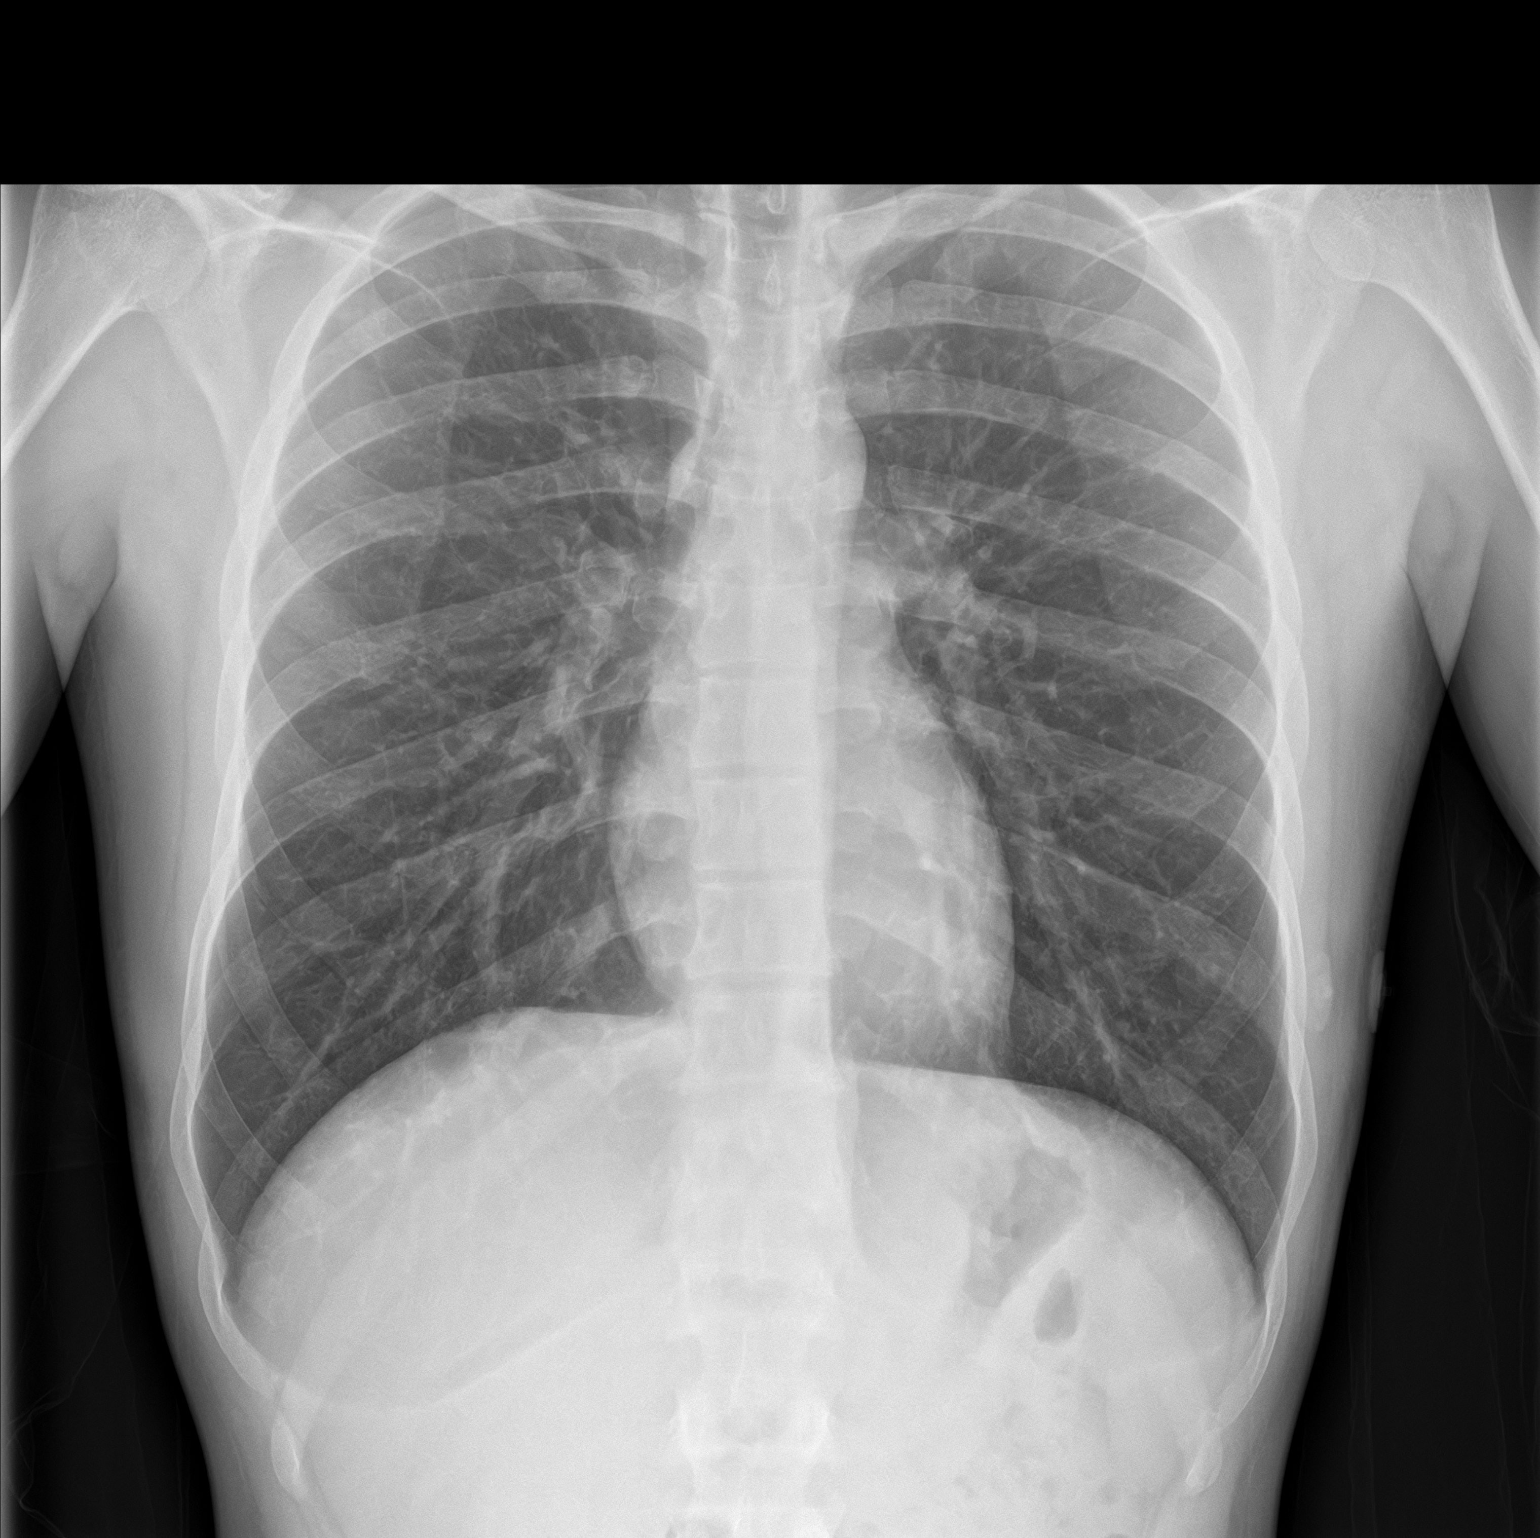
[im 2/2]
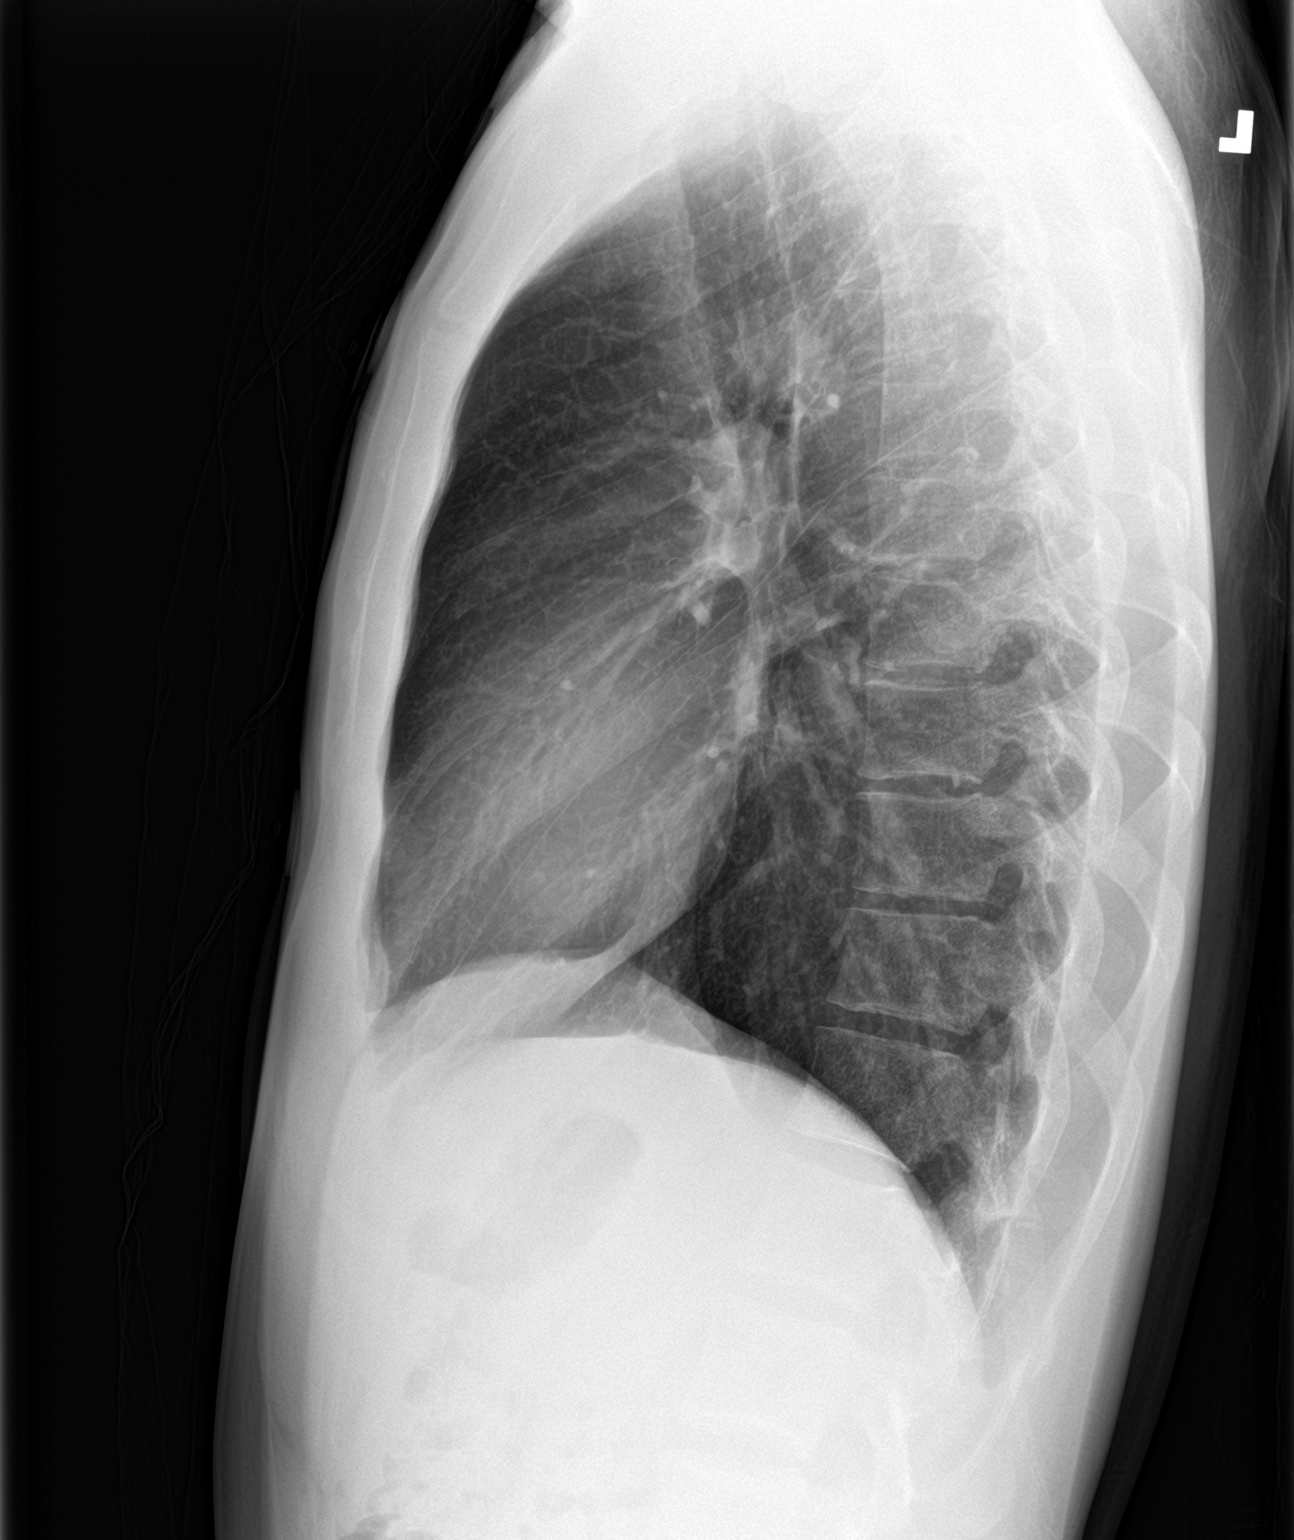

[2 of 2 positions shown; findings below may reference images not displayed]

FINDINGS: Normal lung volumes and mediastinal contours. Visualized tracheal
air column is within normal limits. Both lungs appear clear. No
pneumothorax or pleural effusion. No osseous abnormality identified.
Negative visible bowel gas.
IMPRESSION: Negative.  No cardiopulmonary abnormality.
